# Patient Record
Sex: Female | Born: 1995 | Race: Black or African American | Hispanic: No | Marital: Single | Smoking: Never smoker
Health system: Southern US, Community
[De-identification: ages and names within clinical notes are randomized; demographics above are authoritative.]

## PROBLEM LIST (undated history)

## (undated) DIAGNOSIS — J45909 Unspecified asthma, uncomplicated: Secondary | ICD-10-CM

---

## 2015-01-14 ENCOUNTER — Emergency Department (HOSPITAL_COMMUNITY)
Admission: EM | Admit: 2015-01-14 | Discharge: 2015-01-15 | Disposition: A | Discharge status: Home / Self Care | Payer: Medicaid Other | Attending: Emergency Medicine | Admitting: Emergency Medicine

## 2015-01-14 ENCOUNTER — Emergency Department (HOSPITAL_COMMUNITY): Payer: Medicaid Other

## 2015-01-14 ENCOUNTER — Encounter (HOSPITAL_COMMUNITY): Payer: Self-pay | Admitting: Emergency Medicine

## 2015-01-14 DIAGNOSIS — Z3202 Encounter for pregnancy test, result negative: Secondary | ICD-10-CM | POA: Insufficient documentation

## 2015-01-14 DIAGNOSIS — J45901 Unspecified asthma with (acute) exacerbation: Secondary | ICD-10-CM | POA: Insufficient documentation

## 2015-01-14 DIAGNOSIS — R000 Tachycardia, unspecified: Secondary | ICD-10-CM | POA: Insufficient documentation

## 2015-01-14 DIAGNOSIS — J159 Unspecified bacterial pneumonia: Secondary | ICD-10-CM | POA: Insufficient documentation

## 2015-01-14 DIAGNOSIS — R0602 Shortness of breath: Secondary | ICD-10-CM | POA: Diagnosis present

## 2015-01-14 DIAGNOSIS — J189 Pneumonia, unspecified organism: Secondary | ICD-10-CM

## 2015-01-14 HISTORY — DX: Unspecified asthma, uncomplicated: J45.909

## 2015-01-14 LAB — CBC WITH DIFFERENTIAL/PLATELET
BASOS ABS: 0 10*3/uL (ref 0.0–0.1)
Basophils Relative: 0 %
EOS ABS: 0 10*3/uL (ref 0.0–0.7)
Eosinophils Relative: 1 %
HCT: 40.7 % (ref 36.0–46.0)
HEMOGLOBIN: 13.3 g/dL (ref 12.0–15.0)
LYMPHS ABS: 1.3 10*3/uL (ref 0.7–4.0)
LYMPHS PCT: 19 %
MCH: 28.9 pg (ref 26.0–34.0)
MCHC: 32.7 g/dL (ref 30.0–36.0)
MCV: 88.5 fL (ref 78.0–100.0)
Monocytes Absolute: 0.4 10*3/uL (ref 0.1–1.0)
Monocytes Relative: 6 %
NEUTROS PCT: 74 %
Neutro Abs: 4.8 10*3/uL (ref 1.7–7.7)
Platelets: 271 10*3/uL (ref 150–400)
RBC: 4.6 MIL/uL (ref 3.87–5.11)
RDW: 13.5 % (ref 11.5–15.5)
WBC: 6.5 10*3/uL (ref 4.0–10.5)

## 2015-01-14 LAB — COMPREHENSIVE METABOLIC PANEL
ALK PHOS: 66 U/L (ref 38–126)
ALT: 18 U/L (ref 14–54)
AST: 23 U/L (ref 15–41)
Albumin: 3.5 g/dL (ref 3.5–5.0)
Anion gap: 9 (ref 5–15)
CALCIUM: 9.4 mg/dL (ref 8.9–10.3)
CO2: 27 mmol/L (ref 22–32)
CREATININE: 0.79 mg/dL (ref 0.44–1.00)
Chloride: 102 mmol/L (ref 101–111)
GFR calc non Af Amer: >60 mL/min (ref >60)
Glucose, Bld: 110 mg/dL — ABNORMAL HIGH (ref 65–99)
Potassium: 3.4 mmol/L — ABNORMAL LOW (ref 3.5–5.1)
SODIUM: 138 mmol/L (ref 135–145)
Total Bilirubin: 0.2 mg/dL — ABNORMAL LOW (ref 0.3–1.2)
Total Protein: 7 g/dL (ref 6.5–8.1)

## 2015-01-14 LAB — POC URINE PREG, ED: Preg Test, Ur: NEGATIVE

## 2015-01-14 LAB — D-DIMER, QUANTITATIVE (NOT AT ARMC): D DIMER QUANT: 0.65 ug{FEU}/mL — AB (ref 0.00–0.48)

## 2015-01-14 MED ORDER — ALBUTEROL SULFATE (2.5 MG/3ML) 0.083% IN NEBU
5.0000 mg | INHALATION_SOLUTION | Freq: Once | RESPIRATORY_TRACT | Status: AC
  Administered 2015-01-14: 5 mg via RESPIRATORY_TRACT

## 2015-01-14 MED ORDER — SODIUM CHLORIDE 0.9 % IV BOLUS (SEPSIS)
500.0000 mL | Freq: Once | INTRAVENOUS | Status: AC
Start: 2015-01-14 — End: 2015-01-15
  Administered 2015-01-15: 1000 mL via INTRAVENOUS

## 2015-01-14 MED ORDER — ALBUTEROL SULFATE (2.5 MG/3ML) 0.083% IN NEBU
INHALATION_SOLUTION | RESPIRATORY_TRACT | Status: AC
  Filled 2015-01-14: qty 6

## 2015-01-14 NOTE — ED Notes (Signed)
The patient said she was sent here from the [facility] because they said she was tachycardic.  The patient said she has asthma but does not have any more refills.  She has been coughing and having SOB since earlier today.  She also says she is having left leg pain but it went away with ibuprofen.

## 2015-01-14 NOTE — ED Provider Notes (Signed)
CSN: [id number]     Arrival date & time 01/14/15  2000 History   First MD Initiated Contact with Patient 01/14/15 2242     Chief Complaint  Patient presents with  . Shortness of Breath    The patient said she was sent here from the student health center at [employer] because they said she was tachycardic.       (Consider location/radiation/quality/duration/timing/severity/associated sxs/prior Treatment) Patient is a 19 y.o. female presenting with shortness of breath. The history is provided by the patient.  Shortness of Breath Associated symptoms: no abdominal pain, no chest pain and no rash    patient presents with shortness of breath. She's had cough with minimal sputum production. Has had some chills. Has slight aching in her left arm and left leg. 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 Does have a history of asthma.  Past Medical History  Diagnosis Date  . Asthma    History reviewed. No pertinent past surgical history. History reviewed. No pertinent family history. Social History  Substance Use Topics  . Smoking status: Never Smoker   . Smokeless tobacco: Never Used  . Alcohol Use: Yes     Comment: occ   OB History    No data available     Review of Systems  Constitutional: Negative for chills.  HENT: Negative for dental problem.   Respiratory: Positive for shortness of breath.   Cardiovascular: Negative for chest pain.  Gastrointestinal: Negative for abdominal pain.  Genitourinary: Negative for flank pain.  Musculoskeletal: Negative for back pain.  Skin: Negative for rash.  Neurological: Negative for weakness and light-headedness.      Allergies  Review of patient's allergies indicates not on file.  Home Medications   Prior to Admission medications   Not on File   BP 151/87 mmHg  Pulse 121  Temp(Src) 99 F (37.2 C) (Oral)  Resp 16  SpO2 100%  LMP 12/31/2014 Physical Exam  Constitutional: She  appears well-developed.  HENT:  Head: Atraumatic.  Cardiovascular:  Tachycardia  Pulmonary/Chest: Effort normal and breath sounds normal.  Abdominal: Soft.  Musculoskeletal: She exhibits no edema or tenderness.  Neurological: She is alert.  Skin: Skin is warm.  Psychiatric: She has a normal mood and affect.    ED Course  Procedures (including critical care time) Labs Review Labs Reviewed  COMPREHENSIVE METABOLIC PANEL - Abnormal; Notable for the following:    Potassium 3.4 (*)    Glucose, Bld 110 (*)    BUN <5 (*)    Total Bilirubin 0.2 (*)    All other components within normal limits  D-DIMER, QUANTITATIVE (NOT AT [facility]) - Abnormal; Notable for the following:    D-Dimer, Quant 0.65 (*)    All other components within normal limits  CBC WITH DIFFERENTIAL/PLATELET  POC URINE PREG, ED    Imaging Review Dg Chest 2 View  01/14/2015   CLINICAL DATA:  Coughing.  Shortness of breath.  Tachycardia.  EXAM: CHEST  2 VIEW  COMPARISON:  None.  FINDINGS: Abnormal left lower lobe band of opacity best appreciated on the frontal projection. The lungs appear otherwise clear. No pleural effusion. Cardiac and mediastinal margins appear normal.  IMPRESSION: 1. Retrocardiac band of opacity in the left lower lobe. Likely differential diagnostic considerations given the radiographic appearance include atelectasis and pneumonia.   Electronically Signed   By: Gaylyn Rong M.D.   On: 01/14/2015 21:23   I have  personally reviewed and evaluated these images and lab results as part of my medical decision-making.   EKG Interpretation   Date/Time:  Monday January 14 2015 20:07:01 EDT Ventricular Rate:  125 PR Interval:  126 QRS Duration: 94 QT Interval:  316 QTC Calculation: 456 R Axis:   39 Text Interpretation:  Sinus tachycardia Inferior Q waves noted Abnormal  ECG Confirmed by Rubin Payor  MD, Harrold Donath [phone]) on 01/14/2015 11:00:38 PM      MDM   Final diagnoses:  None    Patient with  shortness of breath and slight chest pain. Tachycardia. Tachycardia appears and does have inferior Q waves. X-ray showed atelectasis versus pneumonia. Normal white count and no fever. D-dimer done and is not negative. Will get CT angiography.    Benjiman Core, MD 01/14/15 [phone]

## 2015-01-15 ENCOUNTER — Emergency Department (HOSPITAL_COMMUNITY): Payer: Medicaid Other

## 2015-01-15 ENCOUNTER — Encounter (HOSPITAL_COMMUNITY): Payer: Self-pay | Admitting: Radiology

## 2015-01-15 MED ORDER — IBUPROFEN 400 MG PO TABS
600.0000 mg | ORAL_TABLET | Freq: Once | ORAL | Status: AC
  Administered 2015-01-15: 600 mg via ORAL
  Filled 2015-01-15 (×2): qty 1

## 2015-01-15 MED ORDER — LEVOFLOXACIN 500 MG PO TABS: 500.0000 mg | ORAL_TABLET | Freq: Every day | ORAL | Status: DC

## 2015-01-15 MED ORDER — ACETAMINOPHEN 325 MG PO TABS
650.0000 mg | ORAL_TABLET | Freq: Once | ORAL | Status: AC
  Administered 2015-01-15: 650 mg via ORAL
  Filled 2015-01-15: qty 2

## 2015-01-15 MED ORDER — LEVOFLOXACIN 500 MG PO TABS
500.0000 mg | ORAL_TABLET | Freq: Once | ORAL | Status: AC
  Administered 2015-01-15: 500 mg via ORAL
  Filled 2015-01-15: qty 1

## 2015-01-15 MED ORDER — ALBUTEROL SULFATE HFA 108 (90 BASE) MCG/ACT IN AERS
2.0000 | INHALATION_SPRAY | RESPIRATORY_TRACT | Status: DC
  Administered 2015-01-15: 2 via RESPIRATORY_TRACT
  Filled 2015-01-15: qty 6.7

## 2015-01-15 MED ORDER — IOHEXOL 350 MG/ML SOLN
80.0000 mL | Freq: Once | INTRAVENOUS | Status: AC | PRN
  Administered 2015-01-15: 80 mL via INTRAVENOUS

## 2015-01-15 MED ORDER — SODIUM CHLORIDE 0.9 % IV BOLUS (SEPSIS)
1000.0000 mL | Freq: Once | INTRAVENOUS | Status: AC
  Administered 2015-01-15: 1000 mL via INTRAVENOUS

## 2015-01-15 NOTE — Discharge Instructions (Signed)

## 2015-01-15 NOTE — ED Notes (Signed)
Pt returned from CT and moved to C-29  Report given to [place], [place]

## 2015-01-15 NOTE — ED Notes (Signed)
Campos MD at bedside. 

## 2015-01-15 NOTE — ED Notes (Signed)
Pt alert and oriented x's 3.  Speaking in full sentences.

## 2015-01-15 NOTE — ED Notes (Signed)
Pt up to restroom.  States she has been urinating a lot today.

## 2015-01-15 NOTE — ED Notes (Signed)
Pt to CT at this time.

## 2015-01-15 NOTE — ED Notes (Signed)
Pt up to restroom.  Able to ambulate independently.  Denies severe SOB with walking, SpO2 99% on RA once arrived back to room.

## 2015-01-15 NOTE — ED Provider Notes (Signed)
3:29 AM Ambulate around emergency department without shortness of breath.  No hypoxia.  No significant increase in her heart rate with ambulation.  Much of her heart rate secondary bronchodilators and fever.  She was given within an additional 1 L IV fluid bolus.  She is found to have a fever 102 in emergency department.  Her CT scan is negative for pulmonary embolism and demonstrates left lower lobe pneumonia.  Patient was given Levaquin in the ER.  Discharge home on Levaquin.  Patient understands to return to the ER for new or worsening symptoms  Dg Chest 2 View  01/14/2015   CLINICAL DATA:  Coughing.  Shortness of breath.  Tachycardia.  EXAM: CHEST  2 VIEW  COMPARISON:  None.  FINDINGS: Abnormal left lower lobe band of opacity best appreciated on the frontal projection. The lungs appear otherwise clear. No pleural effusion. Cardiac and mediastinal margins appear normal.  IMPRESSION: 1. Retrocardiac band of opacity in the left lower lobe. Likely differential diagnostic considerations given the radiographic appearance include atelectasis and pneumonia.   Electronically Signed   By: Gaylyn Rong M.D.   On: 01/14/2015 21:23   Ct Angio Chest Pe W/cm &/or Wo Cm  01/15/2015   CLINICAL DATA:  Tachycardia, cough, LEFT extremity pain. History of asthma.  EXAM: CT ANGIOGRAPHY CHEST WITH CONTRAST  TECHNIQUE: Multidetector CT imaging of the chest was performed using the standard protocol during bolus administration of intravenous contrast. Multiplanar CT image reconstructions and MIPs were obtained to evaluate the vascular anatomy.  CONTRAST:  80mL OMNIPAQUE IOHEXOL 350 MG/ML SOLN  COMPARISON:  Chest radiograph January 14, 2015  FINDINGS: Large body habitus results in overall noisy image quality.  PULMONARY ARTERY: Adequate contrast opacification of the pulmonary artery's. Main pulmonary artery is not enlarged. No pulmonary arterial filling defects to the level of the subsegmental branches.  MEDIASTINUM: Heart  and pericardium are unremarkable, no right heart strain. Thoracic aorta is normal course and caliber, unremarkable. Mild LEFT hilar lymphadenopathy, prominent though not pathologically enlarged subcarinal lymph node.  LUNGS: Tracheobronchial tree is patent, no pneumothorax. Mild heterogeneous lung attenuation compatible with small airway disease. Mild bronchial wall thickening. Patchy consolidation LEFT lower lobe. No pleural effusion.  SOFT TISSUES AND OSSEOUS STRUCTURES: Included view of the abdomen is unremarkable. Visualized soft tissues and included osseous structures appear normal.  Review of the MIP images confirms the above findings.  IMPRESSION: No acute pulmonary embolism.  LEFT lower lobe bronchopneumonia. LEFT hilar lymphadenopathy is likely reactive.   Electronically Signed   By: Awilda Metro M.D.   On: 01/15/2015 01:16   I personally reviewed the imaging tests through PACS system I reviewed available ER/hospitalization records through the EMR   EKG Interpretation  Date/Time:  Monday January 14 2015 20:07:01 EDT Ventricular Rate:  125 PR Interval:  126 QRS Duration: 94 QT Interval:  316 QTC Calculation: 456 R Axis:   39 Text Interpretation:  Sinus tachycardia Inferior Q waves noted Abnormal ECG Confirmed by Rubin Payor  MD, NATHAN [phone]) on 01/14/2015 11:00:38 PM        Azalia Bilis, MD 01/15/15 0330

## 2015-01-15 NOTE — ED Notes (Signed)
Patient left at this time with all belongings. 

## 2015-03-28 ENCOUNTER — Encounter (HOSPITAL_COMMUNITY): Payer: Self-pay | Admitting: Emergency Medicine

## 2015-03-28 ENCOUNTER — Emergency Department (HOSPITAL_COMMUNITY)
Admission: EM | Admit: 2015-03-28 | Discharge: 2015-03-28 | Disposition: A | Discharge status: Home / Self Care | Payer: Medicaid Other | Attending: Emergency Medicine | Admitting: Emergency Medicine

## 2015-03-28 DIAGNOSIS — R21 Rash and other nonspecific skin eruption: Secondary | ICD-10-CM | POA: Insufficient documentation

## 2015-03-28 DIAGNOSIS — R002 Palpitations: Secondary | ICD-10-CM

## 2015-03-28 DIAGNOSIS — J45909 Unspecified asthma, uncomplicated: Secondary | ICD-10-CM | POA: Insufficient documentation

## 2015-03-28 DIAGNOSIS — R0602 Shortness of breath: Secondary | ICD-10-CM | POA: Insufficient documentation

## 2015-03-28 DIAGNOSIS — Z79899 Other long term (current) drug therapy: Secondary | ICD-10-CM | POA: Insufficient documentation

## 2015-03-28 DIAGNOSIS — Z792 Long term (current) use of antibiotics: Secondary | ICD-10-CM | POA: Insufficient documentation

## 2015-03-28 LAB — BASIC METABOLIC PANEL
ANION GAP: 8 (ref 5–15)
BUN: 7 mg/dL (ref 6–20)
CO2: 28 mmol/L (ref 22–32)
Calcium: 9.3 mg/dL (ref 8.9–10.3)
Chloride: 103 mmol/L (ref 101–111)
Creatinine, Ser: 0.77 mg/dL (ref 0.44–1.00)
GFR calc Af Amer: >60 mL/min (ref >60)
GLUCOSE: 118 mg/dL — AB (ref 65–99)
POTASSIUM: 3.4 mmol/L — AB (ref 3.5–5.1)
Sodium: 139 mmol/L (ref 135–145)

## 2015-03-28 LAB — CBC WITH DIFFERENTIAL/PLATELET
BASOS PCT: 0 %
Basophils Absolute: 0 10*3/uL (ref 0.0–0.1)
EOS ABS: 0.2 10*3/uL (ref 0.0–0.7)
Eosinophils Relative: 2 %
HCT: 40.6 % (ref 36.0–46.0)
HEMOGLOBIN: 13 g/dL (ref 12.0–15.0)
LYMPHS ABS: 1.7 10*3/uL (ref 0.7–4.0)
Lymphocytes Relative: 19 %
MCH: 28.7 pg (ref 26.0–34.0)
MCHC: 32 g/dL (ref 30.0–36.0)
MCV: 89.6 fL (ref 78.0–100.0)
Monocytes Absolute: 0.3 10*3/uL (ref 0.1–1.0)
Monocytes Relative: 3 %
NEUTROS PCT: 76 %
Neutro Abs: 6.7 10*3/uL (ref 1.7–7.7)
Platelets: 321 10*3/uL (ref 150–400)
RBC: 4.53 MIL/uL (ref 3.87–5.11)
RDW: 13.4 % (ref 11.5–15.5)
WBC: 8.9 10*3/uL (ref 4.0–10.5)

## 2015-03-28 LAB — I-STAT BETA HCG BLOOD, ED (MC, WL, AP ONLY): I-stat hCG, quantitative: <5 m[IU]/mL (ref <5)

## 2015-03-28 LAB — D-DIMER, QUANTITATIVE (NOT AT ARMC): D DIMER QUANT: 0.39 ug{FEU}/mL (ref 0.00–0.50)

## 2015-03-28 LAB — TSH: TSH: 0.841 u[IU]/mL (ref 0.350–4.500)

## 2015-03-28 MED ORDER — SODIUM CHLORIDE 0.9 % IV BOLUS (SEPSIS)
1000.0000 mL | Freq: Once | INTRAVENOUS | Status: AC
  Administered 2015-03-28: 1000 mL via INTRAVENOUS

## 2015-03-28 MED ORDER — METHYLPREDNISOLONE SODIUM SUCC 125 MG IJ SOLR
125.0000 mg | Freq: Once | INTRAMUSCULAR | Status: AC
  Administered 2015-03-28: 125 mg via INTRAVENOUS
  Filled 2015-03-28: qty 2

## 2015-03-28 MED ORDER — DIPHENHYDRAMINE HCL 50 MG/ML IJ SOLN
25.0000 mg | Freq: Once | INTRAMUSCULAR | Status: AC
  Administered 2015-03-28: 25 mg via INTRAVENOUS
  Filled 2015-03-28: qty 1

## 2015-03-28 NOTE — Discharge Instructions (Signed)
Follow-up the results of your thyroid study. If this test is abnormal, follow-up with your primary Dr. If the results are normal, follow-up with cardiology. The cardiology clinic contact information has been provided in this discharge summary for you to call and arrange this appointment.  Return to the emergency department if your symptoms significantly worsen or change.   Palpitations A palpitation is the feeling that your heartbeat is irregular or is faster than normal. It may feel like your heart is fluttering or skipping a beat. Palpitations are usually not a serious problem. However, in some cases, you may need further medical evaluation. CAUSES  Palpitations can be caused by:  Smoking.  Caffeine or other stimulants, such as diet pills or energy drinks.  Alcohol.  Stress and anxiety.  Strenuous physical activity.  Fatigue.  Certain medicines.  Heart disease, especially if you have a history of irregular heart rhythms (arrhythmias), such as atrial fibrillation, atrial flutter, or supraventricular tachycardia.  An improperly working pacemaker or defibrillator. DIAGNOSIS  To find the cause of your palpitations, your health care provider will take your medical history and perform a physical exam. Your health care provider may also have you take a test called an ambulatory electrocardiogram (ECG). An ECG records your heartbeat patterns over a 24-hour period. You may also have other tests, such as:  Transthoracic echocardiogram (TTE). During echocardiography, sound waves are used to evaluate how blood flows through your heart.  Transesophageal echocardiogram (TEE).  Cardiac monitoring. This allows your health care provider to monitor your heart rate and rhythm in real time.  Holter monitor. This is a portable device that records your heartbeat and can help diagnose heart arrhythmias. It allows your health care provider to track your heart activity for several days, if  needed.  Stress tests by exercise or by giving medicine that makes the heart beat faster. TREATMENT  Treatment of palpitations depends on the cause of your symptoms and can vary greatly. Most cases of palpitations do not require any treatment other than time, relaxation, and monitoring your symptoms. Other causes, such as atrial fibrillation, atrial flutter, or supraventricular tachycardia, usually require further treatment. HOME CARE INSTRUCTIONS   Avoid:  Caffeinated coffee, tea, soft drinks, diet pills, and energy drinks.  Chocolate.  Alcohol.  Stop smoking if you smoke.  Reduce your stress and anxiety. Things that can help you relax include:  A method of controlling things in your body, such as your heartbeats, with your mind (biofeedback).  Yoga.  Meditation.  Physical activity such as swimming, jogging, or walking.  Get plenty of rest and sleep. SEEK MEDICAL CARE IF:   You continue to have a fast or irregular heartbeat beyond 24 hours.  Your palpitations occur more often. SEEK IMMEDIATE MEDICAL CARE IF:  You have chest pain or shortness of breath.  You have a severe headache.  You feel dizzy or you faint. MAKE SURE YOU:  Understand these instructions.  Will watch your condition.  Will get help right away if you are not doing well or get worse.   This information is not intended to replace advice given to you by your health care provider. Make sure you discuss any questions you have with your health care provider.   Document Released: 04/03/2000 Document Revised: 04/11/2013 Document Reviewed: 06/05/2011 Elsevier Interactive Patient Education [employer]2016 Elsevier Inc.

## 2015-03-28 NOTE — ED Notes (Signed)
Pt arrived by POV. States that she has been having SOB and rapid heart since yesterday evening. Pt states she had some chest pain on the 6th. Having positional chest pain. Also has a new rash that started about an hour ago.

## 2015-03-28 NOTE — ED Provider Notes (Signed)
CSN: [id number]     Arrival date & time 03/28/15  0327 History  By signing my name below, I, Tanda RockersMargaux Venter, attest that this documentation has been prepared under the direction and in the presence of Geoffery Lyonsouglas Nature Kueker, MD. Electronically Signed: Tanda RockersMargaux Venter, ED Scribe. 03/28/2015. 4:01 AM.   Chief Complaint  Patient presents with  . Tachycardia  . Shortness of Breath  . Urticaria   The history is provided by the patient. No language interpreter was used.     HPI Comments: Holly Baird is a 19 y.o. female who presents to the Emergency Department complaining of gradual onset, itchy, rash to forearms that began approximately 1 hour ago. Pt mentions that he husband woke her up tonight because he noticed the rash. Pt also complains of gradual onset, intermittent, chest pain that began 2 day ago. Pt also complains of tachycardia and shortness of breath x 1 day. No new change in medication. Denies diaphoresis, nausea, vomiting, leg swelling, or any other associated symptoms. LNMP: Now.    Past Medical History  Diagnosis Date  . Asthma    History reviewed. No pertinent past surgical history. History reviewed. No pertinent family history. Social History  Substance Use Topics  . Smoking status: Never Smoker   . Smokeless tobacco: Never Used  . Alcohol Use: Yes     Comment: occ   OB History    No data available     Review of Systems  A complete 10 system review of systems was obtained and all systems are negative except as noted in the HPI and PMH.   Allergies  Cephalexin  Home Medications   Prior to Admission medications   Medication Sig Start Date End Date Taking? Authorizing Provider  BLISOVI FE 1/20 1-20 MG-MCG tablet Take 1 tablet by mouth daily. 12/05/14   Historical Provider, MD  levofloxacin (LEVAQUIN) 500 MG tablet Take 1 tablet (500 mg total) by mouth daily. 01/15/15   Azalia BilisKevin Campos, MD   Triage Vitals: BP 158/96 mmHg  Pulse 125  SpO2 99%  LMP 03/28/2015   Physical Exam   Constitutional: She is oriented to person, place, and time. She appears well-developed and well-nourished. No distress.  HENT:  Head: Normocephalic and atraumatic.  Eyes: EOM are normal.  Neck: Normal range of motion.  Cardiovascular: Regular rhythm, S1 normal, S2 normal and normal heart sounds.  Tachycardia present.   Pulmonary/Chest: Breath sounds normal. No respiratory distress. She has no wheezes. She has no rales.  Abdominal: Soft. She exhibits no distension. There is no tenderness.  Musculoskeletal: Normal range of motion.  Neurological: She is alert and oriented to person, place, and time.  Skin: Skin is warm and dry. Rash noted.  There is a macular rash present to both forearms. This blanches and is pruritic.   Psychiatric: She has a normal mood and affect. Judgment normal.  Nursing note and vitals reviewed.   ED Course  Procedures (including critical care time)  DIAGNOSTIC STUDIES: Oxygen Saturation is 99% on RA, normal by my interpretation.    COORDINATION OF CARE: 4:00 AM-Discussed treatment plan with pt at bedside and pt agreed to plan.   Labs Review Labs Reviewed - No data to display  Imaging Review No results found. I have personally reviewed and evaluated these images and lab results as part of my medical decision-making.   EKG Interpretation   Date/Time:  Thursday March 28 2015 04:22:04 EST Ventricular Rate:  113 PR Interval:  141 QRS Duration: 81 QT Interval:  314 QTC Calculation: 430 R Axis:   61 Text Interpretation:  Sinus tachycardia Borderline Q waves in lateral  leads Confirmed by Kynslie Ringle  MD, Dannika Hilgeman ([id number]) on 03/28/2015 4:35:19 AM      MDM   Final diagnoses:  None    Patient presents with complaints of palpitations that woke her from sleep. She's been having episodes of shortness of breath and rapid heart rate. She was seen for similar complaints in September and had extensive workup performed, however no cause was found. According to her  mom, she has been running an elevated heart rate that has been unexplained. Her workup today is essentially unremarkable. Her d-dimer is negative and I highly doubt pulmonary embolism as the cause. It does not appears that she has had a TSH performed in the past, and this will be added on today's laboratory studies. If her TSH is abnormal, I will have her follow-up with her primary Dr. If the TSH is normal, I feel as though cardiology follow-up is appropriate. Patient will be advised to follow the results of TSH on 'my chart' and arrange the appropriate follow-up based on the results.   Nothing today appears emergent and she appears very stable and appropriate for discharge.  I personally performed the services described in this documentation, which was scribed in my presence. The recorded information has been reviewed and is accurate.       Geoffery Lyons, MD 03/28/15 0530

## 2015-03-29 ENCOUNTER — Encounter (HOSPITAL_COMMUNITY): Payer: Self-pay | Admitting: Emergency Medicine

## 2015-03-29 ENCOUNTER — Emergency Department (HOSPITAL_COMMUNITY): Payer: BLUE CROSS/BLUE SHIELD

## 2015-03-29 ENCOUNTER — Emergency Department (INDEPENDENT_AMBULATORY_CARE_PROVIDER_SITE_OTHER)
Admission: EM | Admit: 2015-03-29 | Discharge: 2015-03-29 | Disposition: A | Discharge status: Nursing Home (Includes ICF) | Payer: BLUE CROSS/BLUE SHIELD | Source: Home / Self Care | Attending: Family Medicine | Admitting: Family Medicine

## 2015-03-29 ENCOUNTER — Emergency Department (HOSPITAL_COMMUNITY)
Admission: EM | Admit: 2015-03-29 | Discharge: 2015-03-30 | Disposition: A | Discharge status: Home / Self Care | Payer: BLUE CROSS/BLUE SHIELD | Attending: Emergency Medicine | Admitting: Emergency Medicine

## 2015-03-29 DIAGNOSIS — R000 Tachycardia, unspecified: Secondary | ICD-10-CM | POA: Diagnosis not present

## 2015-03-29 DIAGNOSIS — R21 Rash and other nonspecific skin eruption: Secondary | ICD-10-CM | POA: Diagnosis not present

## 2015-03-29 DIAGNOSIS — Z79899 Other long term (current) drug therapy: Secondary | ICD-10-CM | POA: Insufficient documentation

## 2015-03-29 DIAGNOSIS — K759 Inflammatory liver disease, unspecified: Secondary | ICD-10-CM | POA: Diagnosis not present

## 2015-03-29 DIAGNOSIS — R0789 Other chest pain: Secondary | ICD-10-CM | POA: Diagnosis not present

## 2015-03-29 DIAGNOSIS — R509 Fever, unspecified: Secondary | ICD-10-CM

## 2015-03-29 DIAGNOSIS — Z79818 Long term (current) use of other agents affecting estrogen receptors and estrogen levels: Secondary | ICD-10-CM | POA: Insufficient documentation

## 2015-03-29 DIAGNOSIS — J45901 Unspecified asthma with (acute) exacerbation: Secondary | ICD-10-CM | POA: Diagnosis not present

## 2015-03-29 LAB — URINE MICROSCOPIC-ADD ON: RBC / HPF: NONE SEEN RBC/hpf (ref 0–5)

## 2015-03-29 LAB — URINALYSIS, ROUTINE W REFLEX MICROSCOPIC
Glucose, UA: NEGATIVE mg/dL
HGB URINE DIPSTICK: NEGATIVE
KETONES UR: NEGATIVE mg/dL
NITRITE: NEGATIVE
PROTEIN: NEGATIVE mg/dL
Specific Gravity, Urine: 1.026 (ref 1.005–1.030)
pH: 6.5 (ref 5.0–8.0)

## 2015-03-29 MED ORDER — IBUPROFEN 800 MG PO TABS
800.0000 mg | ORAL_TABLET | Freq: Once | ORAL | Status: AC
  Administered 2015-03-29: 800 mg via ORAL
  Filled 2015-03-29: qty 1

## 2015-03-29 MED ORDER — SODIUM CHLORIDE 0.9 % IV BOLUS (SEPSIS)
1000.0000 mL | Freq: Once | INTRAVENOUS | Status: AC
  Administered 2015-03-29: 1000 mL via INTRAVENOUS

## 2015-03-29 NOTE — ED Notes (Signed)
Pt presents with HR in 140's and difficulty taking deep breath. Diffuse rash noted, pt denies urticaria. Pt has been seen several times for same without resolution. Pt calm in triage, NAD.

## 2015-03-29 NOTE — ED Notes (Signed)
Bed: WA23 Expected date:  Expected time:  Means of arrival:  Comments: Triage 1 

## 2015-03-29 NOTE — ED Provider Notes (Signed)
CSN: [id number]     Arrival date & time 03/29/15  2031 History   First MD Initiated Contact with Patient 03/29/15 2116     Chief Complaint  Patient presents with  . Tachycardia     (Consider location/radiation/quality/duration/timing/severity/associated sxs/prior Treatment) The history is provided by the patient and a relative (The patient's mother is a family Publishing rights manager).   patient presents with shortness of breath and upper chest pain. Has been going for the last 2 days. Seen in the ER yesterday for shortness of breath chest pain and a diffuse rash. She's had a tachycardia. She was seen at urgent care and sent here. Given steroids and benadryl yesterday. Had d-dimer and reassuring blood work then. Around 3 months ago she was seen by me in the ER and found to have a pneumonia. She was tachycardic event also. She had a CT angiography which did not show pulmonary embolism. The pain now is dull and in her upper chest. She will occasionally get cramping in her right arm and occasionally in the left leg.   Past Medical History  Diagnosis Date  . Asthma    History reviewed. No pertinent past surgical history. No family history on file. Social History  Substance Use Topics  . Smoking status: Never Smoker   . Smokeless tobacco: Never Used  . Alcohol Use: Yes     Comment: occ   OB History    No data available     Review of Systems  HENT: Negative for sore throat.   Respiratory: Positive for chest tightness and shortness of breath. Negative for cough.   Gastrointestinal: Negative for abdominal pain.  Genitourinary: Negative for frequency.  Musculoskeletal: Negative for back pain.  Skin: Positive for color change and rash. Negative for wound.  Neurological: Negative for weakness and numbness.  Hematological: Negative for adenopathy.      Allergies  Apple and Cephalexin  Home Medications   Prior to Admission medications   Medication Sig Start Date End Date Taking?  Authorizing Provider  albuterol (PROVENTIL HFA;VENTOLIN HFA) 108 (90 BASE) MCG/ACT inhaler Inhale 2 puffs into the lungs every 6 (six) hours as needed for wheezing or shortness of breath.   Yes Historical Provider, MD  norethindrone-ethinyl estradiol (JUNEL FE,GILDESS FE,LOESTRIN FE) 1-20 MG-MCG tablet Take 1 tablet by mouth daily.   Yes Historical Provider, MD  levofloxacin (LEVAQUIN) 500 MG tablet Take 1 tablet (500 mg total) by mouth daily. Patient not taking: Reported on 03/29/2015 01/15/15   Azalia Bilis, MD   BP 144/91 mmHg  Pulse 118  Temp(Src) 100.9 F (38.3 C) (Oral)  Resp 23  SpO2 100%  LMP 03/24/2015 Physical Exam  Constitutional: She appears well-developed.  HENT:  Head: Atraumatic.  Mouth/Throat: No oropharyngeal exudate.  Neck: No tracheal deviation present.  Cardiovascular: Regular rhythm.   Tachycardia  Pulmonary/Chest: Effort normal.  Mild tachypnea. No rales. No wheezes.  Abdominal: Soft.  Lymphadenopathy:    She has no cervical adenopathy.  Neurological: She is alert.  Skin: There is erythema.  Skin feels hot. Erythematous rash to back and arms. Also some on legs. Mostly blanching.    ED Course  Procedures (including critical care time) Labs Review Labs Reviewed  URINALYSIS, ROUTINE W REFLEX MICROSCOPIC (NOT AT [facility]) - Abnormal; Notable for the following:    Color, Urine ORANGE (*)    Bilirubin Urine LARGE (*)    Leukocytes, UA SMALL (*)    All other components within normal limits  URINE MICROSCOPIC-ADD ON - Abnormal;  Notable for the following:    Squamous Epithelial / LPF 0-5 (*)    Bacteria, UA RARE (*)    All other components within normal limits  HEPATIC FUNCTION PANEL  SEDIMENTATION RATE  URINE RAPID DRUG SCREEN, HOSP PERFORMED  CBC WITH DIFFERENTIAL/PLATELET  BASIC METABOLIC PANEL  I-STAT CG4 LACTIC ACID, ED    Imaging Review Dg Chest 2 View  03/29/2015  CLINICAL DATA:  19 year old with tachycardia, shortness of breath, fever, left-sided  chest pain and urticaria. Personal history of pneumonia in September, 2016. EXAM: CHEST  2 VIEW COMPARISON:  01/14/2015 chest x-ray and CTA chest 01/15/2015. FINDINGS: Suboptimal inspiration accounts for crowded bronchovascular markings, especially in the bases, and accentuates the cardiac silhouette. Taking this into account, cardiomediastinal silhouette unremarkable and unchanged. Interval resolution of the airspace consolidation in the medial left lower lobe with minimal scarring. Lungs otherwise clear. No localized airspace consolidation. No pleural effusions. No pneumothorax. Normal pulmonary vascularity. Visualized bony thorax intact. IMPRESSION: Suboptimal inspiration.  No acute cardiopulmonary disease. Electronically Signed   By: Hulan Saashomas  Lawrence M.D.   On: 03/29/2015 21:55   I have personally reviewed and evaluated these images and lab results as part of my medical decision-making.   EKG Interpretation   Date/Time:  Friday March 29 2015 20:40:36 EST Ventricular Rate:  143 PR Interval:  108 QRS Duration: 79 QT Interval:  333 QTC Calculation: 514 R Axis:   28 Text Interpretation:  Sinus tachycardia inferior Q waves still present.  Prolonged QT interval Baseline wander in lead(s) II III aVF rate mildly  incresed since last tracing.  Confirmed by Rubin PayorPICKERING  MD, Harrold DonathNATHAN [phone]  on 03/29/2015 9:19:17 PM      MDM   Final diagnoses:  Tachycardia  Fever, unspecified fever cause    Pati presents with fever and tachycardia. Has been seen previously and diagnosed with pneumonia. X-ray shows no pneumonia this time. Negative d-dimer yesterday. Urinalysis does show elevated bilirubin. Will add some more lab work. We'll give IV fluids. Discussed with Dr. Antoine PocheHochrein from cardiology will arrange follow-up. Had extensive discussion with the patient and her mother, was family Publishing rights managernurse practitioner. Care turned over to Dr. Elberta LeatherwoodGlick    Tazia Illescas, MD 03/29/15 [phone]

## 2015-03-29 NOTE — Progress Notes (Signed)
Patent listed as having [employer] insurance without a pcp.  EDCM spoke to patient at bedside.  Patient confirms [place]cp [name].  System updated.

## 2015-03-29 NOTE — ED Notes (Signed)
Patient reported complaints of burning sensation in center back. Onset of this was 2 hours ago.   Area was itching initially and now burning.  While assessing patient, noticed red areas to skin, splotchy areas, not raised and these areas not itching.  Patient reports going to the ed for sob, rash, urticaria, swelling 12/8.  Also, during assessment by physician, mother brings up concerns for heart rate in 120's since September 2016.  Mother reports patient has had pneumonia and has been treated with 2 rounds of levaquin and reports pcp in [place] performed chest xray 3 weeks ago and reported this as negative.  Patient and mother with many concerns and not all data in [place] epic, at least one visit by pcp in [place].

## 2015-03-29 NOTE — ED Provider Notes (Signed)
CSN: [id number]     Arrival date & time 03/29/15  1916 History   First MD Initiated Contact with Patient 03/29/15 1954     Chief Complaint  Patient presents with  . Back Pain   (Consider location/radiation/quality/duration/timing/severity/associated sxs/prior Treatment) Patient is a 19 y.o. female presenting with back pain. The history is provided by the patient and a parent.  Back Pain Location:  Generalized Quality:  Burning Radiates to:  Does not radiate Pain severity:  No pain Progression:  Unchanged Chronicity:  Chronic Context comment:  Sx for 3 mos of tachycardia, cap, 2 rounds of levaquin, given solumedrol and bendaryl for itchy rash, also with chest pain. Associated symptoms: chest pain, fever and numbness   Risk factors: steroid use     Past Medical History  Diagnosis Date  . Asthma    History reviewed. No pertinent past surgical history. No family history on file. Social History  Substance Use Topics  . Smoking status: Never Smoker   . Smokeless tobacco: Never Used  . Alcohol Use: Yes     Comment: occ   OB History    No data available     Review of Systems  Constitutional: Positive for fever.  Respiratory: Positive for shortness of breath.   Cardiovascular: Positive for chest pain and palpitations.  Gastrointestinal: Negative.   Musculoskeletal: Positive for back pain.  Skin: Positive for rash.  Neurological: Positive for numbness.  All other systems reviewed and are negative.   Allergies  Apple and Cephalexin  Home Medications   Prior to Admission medications   Medication Sig Start Date End Date Taking? Authorizing Provider  levofloxacin (LEVAQUIN) 500 MG tablet Take 1 tablet (500 mg total) by mouth daily. 01/15/15   Azalia BilisKevin Campos, MD   Meds Ordered and Administered this Visit  Medications - No data to display  BP 138/92 mmHg  Pulse 130  Temp(Src) 99 F (37.2 C) (Oral)  Resp 28  SpO2 99%  LMP 03/28/2015 No data found.   Physical Exam   Constitutional: She is oriented to person, place, and time. She appears well-developed and well-nourished. No distress.  HENT:  Right Ear: External ear normal.  Left Ear: External ear normal.  Mouth/Throat: Oropharynx is clear and moist.  Neck: Normal range of motion. Neck supple.  Cardiovascular: Regular rhythm, normal heart sounds, intact distal pulses and normal pulses.  Tachycardia present.   Pulmonary/Chest: Effort normal and breath sounds normal.  Musculoskeletal: Normal range of motion.  Neurological: She is alert and oriented to person, place, and time.  Skin: Skin is warm and dry. Rash noted. There is erythema.  Generalized skin hyperemia.no focal lesions.  Nursing note and vitals reviewed.   ED Course  Procedures (including critical care time)  Labs Review Labs Reviewed - No data to display  Imaging Review No results found.   Visual Acuity Review  Right Eye Distance:   Left Eye Distance:   Bilateral Distance:    Right Eye Near:   Left Eye Near:    Bilateral Near:         MDM   1. Tachycardia determined by examination of pulse    Sent for re-eval of sx seen last eve and continue with concern by pt and mother.    Linna HoffJames D Weda Baumgarner, MD 03/29/15 2021

## 2015-03-30 DIAGNOSIS — R000 Tachycardia, unspecified: Secondary | ICD-10-CM | POA: Diagnosis not present

## 2015-03-30 LAB — BASIC METABOLIC PANEL
ANION GAP: 9 (ref 5–15)
BUN: 8 mg/dL (ref 6–20)
CALCIUM: 8.5 mg/dL — AB (ref 8.9–10.3)
CO2: 26 mmol/L (ref 22–32)
CREATININE: 0.85 mg/dL (ref 0.44–1.00)
Chloride: 104 mmol/L (ref 101–111)
Glucose, Bld: 85 mg/dL (ref 65–99)
Potassium: 3.5 mmol/L (ref 3.5–5.1)
SODIUM: 139 mmol/L (ref 135–145)

## 2015-03-30 LAB — RAPID URINE DRUG SCREEN, HOSP PERFORMED
AMPHETAMINES: NOT DETECTED
BARBITURATES: NOT DETECTED
Benzodiazepines: NOT DETECTED
Cocaine: NOT DETECTED
Opiates: NOT DETECTED
TETRAHYDROCANNABINOL: NOT DETECTED

## 2015-03-30 LAB — CBC WITH DIFFERENTIAL/PLATELET
BASOS ABS: 0 10*3/uL (ref 0.0–0.1)
BASOS PCT: 0 %
EOS ABS: 0.5 10*3/uL (ref 0.0–0.7)
EOS PCT: 6 %
HCT: 39.8 % (ref 36.0–46.0)
Hemoglobin: 13.2 g/dL (ref 12.0–15.0)
Lymphocytes Relative: 17 %
Lymphs Abs: 1.4 10*3/uL (ref 0.7–4.0)
MCH: 29.3 pg (ref 26.0–34.0)
MCHC: 33.2 g/dL (ref 30.0–36.0)
MCV: 88.2 fL (ref 78.0–100.0)
MONO ABS: 0.2 10*3/uL (ref 0.1–1.0)
Monocytes Relative: 2 %
NEUTROS ABS: 6.3 10*3/uL (ref 1.7–7.7)
Neutrophils Relative %: 75 %
PLATELETS: 330 10*3/uL (ref 150–400)
RBC: 4.51 MIL/uL (ref 3.87–5.11)
RDW: 13.4 % (ref 11.5–15.5)
WBC: 8.5 10*3/uL (ref 4.0–10.5)

## 2015-03-30 LAB — HEPATIC FUNCTION PANEL
ALBUMIN: 3.2 g/dL — AB (ref 3.5–5.0)
ALT: 281 U/L — ABNORMAL HIGH (ref 14–54)
AST: 180 U/L — ABNORMAL HIGH (ref 15–41)
Alkaline Phosphatase: 144 U/L — ABNORMAL HIGH (ref 38–126)
BILIRUBIN DIRECT: 1.3 mg/dL — AB (ref 0.1–0.5)
BILIRUBIN INDIRECT: 0.7 mg/dL (ref 0.3–0.9)
BILIRUBIN TOTAL: 2 mg/dL — AB (ref 0.3–1.2)
Total Protein: 6.5 g/dL (ref 6.5–8.1)

## 2015-03-30 LAB — SEDIMENTATION RATE: SED RATE: 40 mm/h — AB (ref 0–22)

## 2015-03-30 LAB — I-STAT CG4 LACTIC ACID, ED: LACTIC ACID, VENOUS: 0.79 mmol/L (ref 0.5–2.0)

## 2015-03-30 MED ORDER — ONDANSETRON 8 MG PO TBDP
8.0000 mg | ORAL_TABLET | Freq: Once | ORAL | Status: AC
  Administered 2015-03-30: 8 mg via ORAL
  Filled 2015-03-30: qty 1

## 2015-03-30 MED ORDER — ONDANSETRON HCL 4 MG PO TABS: 4.0000 mg | ORAL_TABLET | Freq: Four times a day (QID) | ORAL | Status: AC | PRN

## 2015-03-30 NOTE — ED Provider Notes (Signed)
Patient initially seen and evaluated by Dr. Rubin Baird with unexplained tachycardia and fever. She was given IV hydration with little change in her heart rate. Laboratory workup shows moderate elevations of AST and ALT and mild elevation of alkaline phosphatase and also elevation of the lumen which is predominantly unconjugated. This is suggestive of viral hepatitis. Hepatitis panel has been drawn. This is been discussed with patient and family. I have recommended follow-up with PCP in 4-5 days to discuss the results of her hepatitis panel and to plan treatment going forward. She has been having difficulty with nausea so she is discharged with prescription for ondansetron. She's given a note to be off work until she can see her PCP.  Results for orders placed or performed during the hospital encounter of 03/29/15  Urinalysis, Routine w reflex microscopic (not at [facility])  Result Value Ref Range   Color, Urine ORANGE (A) YELLOW   APPearance CLEAR CLEAR   Specific Gravity, Urine 1.026 1.005 - 1.030   pH 6.5 5.0 - 8.0   Glucose, UA NEGATIVE NEGATIVE mg/dL   Hgb urine dipstick NEGATIVE NEGATIVE   Bilirubin Urine LARGE (A) NEGATIVE   Ketones, ur NEGATIVE NEGATIVE mg/dL   Protein, ur NEGATIVE NEGATIVE mg/dL   Nitrite NEGATIVE NEGATIVE   Leukocytes, UA SMALL (A) NEGATIVE  Urine microscopic-add on  Result Value Ref Range   Squamous Epithelial / LPF 0-5 (A) NONE SEEN   WBC, UA 0-5 0 - 5 WBC/hpf   RBC / HPF NONE SEEN 0 - 5 RBC/hpf   Bacteria, UA RARE (A) NONE SEEN   Urine-Other MUCOUS PRESENT   Hepatic function panel  Result Value Ref Range   Total Protein 6.5 6.5 - 8.1 g/dL   Albumin 3.2 (L) 3.5 - 5.0 g/dL   AST 161 (H) 15 - 41 U/L   ALT 281 (H) 14 - 54 U/L   Alkaline Phosphatase 144 (H) 38 - 126 U/L   Total Bilirubin 2.0 (H) 0.3 - 1.2 mg/dL   Bilirubin, Direct 1.3 (H) 0.1 - 0.5 mg/dL   Indirect Bilirubin 0.7 0.3 - 0.9 mg/dL  Sedimentation rate  Result Value Ref Range   Sed Rate 40 (H) 0 - 22  mm/hr  Urine rapid drug screen (hosp performed)  Result Value Ref Range   Opiates NONE DETECTED NONE DETECTED   Cocaine NONE DETECTED NONE DETECTED   Benzodiazepines NONE DETECTED NONE DETECTED   Amphetamines NONE DETECTED NONE DETECTED   Tetrahydrocannabinol NONE DETECTED NONE DETECTED   Barbiturates NONE DETECTED NONE DETECTED  CBC with Differential  Result Value Ref Range   WBC 8.5 4.0 - 10.5 K/uL   RBC 4.51 3.87 - 5.11 MIL/uL   Hemoglobin 13.2 12.0 - 15.0 g/dL   HCT 09.6 04.5 - 40.9 %   MCV 88.2 78.0 - 100.0 fL   MCH 29.3 26.0 - 34.0 pg   MCHC 33.2 30.0 - 36.0 g/dL   RDW 81.1 91.4 - 78.2 %   Platelets 330 150 - 400 K/uL   Neutrophils Relative % 75 %   Neutro Abs 6.3 1.7 - 7.7 K/uL   Lymphocytes Relative 17 %   Lymphs Abs 1.4 0.7 - 4.0 K/uL   Monocytes Relative 2 %   Monocytes Absolute 0.2 0.1 - 1.0 K/uL   Eosinophils Relative 6 %   Eosinophils Absolute 0.5 0.0 - 0.7 K/uL   Basophils Relative 0 %   Basophils Absolute 0.0 0.0 - 0.1 K/uL  Basic metabolic panel  Result Value Ref Range  Sodium 139 135 - 145 mmol/L   Potassium 3.5 3.5 - 5.1 mmol/L   Chloride 104 101 - 111 mmol/L   CO2 26 22 - 32 mmol/L   Glucose, Bld 85 65 - 99 mg/dL   BUN 8 6 - 20 mg/dL   Creatinine, Ser 1.610.85 0.44 - 1.00 mg/dL   Calcium 8.5 (L) 8.9 - 10.3 mg/dL   GFR calc non Af Amer >60 >60 mL/min   GFR calc Af Amer >60 >60 mL/min   Anion gap 9 5 - 15  I-Stat CG4 Lactic Acid, ED  Result Value Ref Range   Lactic Acid, Venous 0.79 0.5 - 2.0 mmol/L   Dg Chest 2 View  03/29/2015  CLINICAL DATA:  19 year old with tachycardia, shortness of breath, fever, left-sided chest pain and urticaria. Personal history of pneumonia in September, 2016. EXAM: CHEST  2 VIEW COMPARISON:  01/14/2015 chest x-ray and CTA chest 01/15/2015. FINDINGS: Suboptimal inspiration accounts for crowded bronchovascular markings, especially in the bases, and accentuates the cardiac silhouette. Taking this into account, cardiomediastinal  silhouette unremarkable and unchanged. Interval resolution of the airspace consolidation in the medial left lower lobe with minimal scarring. Lungs otherwise clear. No localized airspace consolidation. No pleural effusions. No pneumothorax. Normal pulmonary vascularity. Visualized bony thorax intact. IMPRESSION: Suboptimal inspiration.  No acute cardiopulmonary disease. Electronically Signed   By: Holly Saashomas  Baird M.D.   On: 03/29/2015 21:55       Dione Boozeavid Yashar Inclan, MD 03/30/15 [id number]

## 2015-03-30 NOTE — Discharge Instructions (Signed)
Your blood tests showed some abnormal liver tests including AST, ALT, alkaline phosphatase, and bilirubin. This suggests that you have a viral hepatitis. That would account for your fever and generally not feeling well. Blood has been drawn to try to find out which virus is causing your hepatitis. Please follow-up with your primary care provider in 4-5 days to discuss the test results.  Ondansetron tablets What is this medicine? ONDANSETRON (on DAN se tron) is used to treat nausea and vomiting caused by chemotherapy. It is also used to prevent or treat nausea and vomiting after surgery. This medicine may be used for other purposes; ask your health care provider or pharmacist if you have questions. What should I tell my health care provider before I take this medicine? They need to know if you have any of these conditions: -heart disease -history of irregular heartbeat -liver disease -low levels of magnesium or potassium in the blood -an unusual or allergic reaction to ondansetron, granisetron, other medicines, foods, dyes, or preservatives -pregnant or trying to get pregnant -breast-feeding How should I use this medicine? Take this medicine by mouth with a glass of water. Follow the directions on your prescription label. Take your doses at regular intervals. Do not take your medicine more often than directed. Talk to your pediatrician regarding the use of this medicine in children. Special care may be needed. Overdosage: If you think you have taken too much of this medicine contact a poison control center or emergency room at once. NOTE: This medicine is only for you. Do not share this medicine with others. What if I miss a dose? If you miss a dose, take it as soon as you can. If it is almost time for your next dose, take only that dose. Do not take double or extra doses. What may interact with this medicine? Do not take this medicine with any of the following  medications: -apomorphine -certain medicines for fungal infections like fluconazole, itraconazole, ketoconazole, posaconazole, voriconazole -cisapride -dofetilide -dronedarone -pimozide -thioridazine -ziprasidone This medicine may also interact with the following medications: -carbamazepine -certain medicines for depression, anxiety, or psychotic disturbances -fentanyl -linezolid -MAOIs like Carbex, Eldepryl, Marplan, Nardil, and Parnate -methylene blue (injected into a vein) -other medicines that prolong the QT interval (cause an abnormal heart rhythm) -phenytoin -rifampicin -tramadol This list may not describe all possible interactions. Give your health care provider a list of all the medicines, herbs, non-prescription drugs, or dietary supplements you use. Also tell them if you smoke, drink alcohol, or use illegal drugs. Some items may interact with your medicine. What should I watch for while using this medicine? Check with your doctor or health care professional right away if you have any sign of an allergic reaction. What side effects may I notice from receiving this medicine? Side effects that you should report to your doctor or health care professional as soon as possible: -allergic reactions like skin rash, itching or hives, swelling of the face, lips or tongue -breathing problems -confusion -dizziness -fast or irregular heartbeat -feeling faint or lightheaded, falls -fever and chills -loss of balance or coordination -seizures -sweating -swelling of the hands or feet -tightness in the chest -tremors -unusually weak or tired Side effects that usually do not require medical attention (report to your doctor or health care professional if they continue or are bothersome): -constipation or diarrhea -headache This list may not describe all possible side effects. Call your doctor for medical advice about side effects. You may report side effects to FDA  at  1-800-FDA-1088. Where should I keep my medicine? Keep out of the reach of children. Store between 2 and 30 degrees C (36 and 86 degrees F). Throw away any unused medicine after the expiration date. NOTE: This sheet is a summary. It may not cover all possible information. If you have questions about this medicine, talk to your doctor, pharmacist, or health care provider.    2016, Elsevier/Gold Standard. (2013-01-11 16:27:45)  Nonspecific Tachycardia Tachycardia is a faster than normal heartbeat (more than 100 beats per minute). In adults, the heart normally beats between 60 and 100 times a minute. A fast heartbeat may be a normal response to exercise or stress. It does not necessarily mean that something is wrong. However, sometimes when your heart beats too fast it may not be able to pump enough blood to the rest of your body. This can result in chest pain, shortness of breath, dizziness, and even fainting. Nonspecific tachycardia means that the specific cause or pattern of your tachycardia is unknown. CAUSES  Tachycardia may be harmless or it may be due to a more serious underlying cause. Possible causes of tachycardia include:  Exercise or exertion.  Fever.  Pain or injury.  Infection.  Loss of body fluids (dehydration).  Overactive thyroid.  Lack of red blood cells (anemia).  Anxiety and stress.  Alcohol.  Caffeine.  Tobacco products.  Diet pills.  Illegal drugs.  Heart disease. SYMPTOMS  Rapid or irregular heartbeat (palpitations).  Suddenly feeling your heart beating (cardiac awareness).  Dizziness.  Tiredness (fatigue).  Shortness of breath.  Chest pain.  Nausea.  Fainting. DIAGNOSIS  Your caregiver will perform a physical exam and take your medical history. In some cases, a heart specialist (cardiologist) may be consulted. Your caregiver may also order:  Blood tests.  Electrocardiography. This test records the electrical activity of your  heart.  A heart monitoring test. TREATMENT  Treatment will depend on the likely cause of your tachycardia. The goal is to treat the underlying cause of your tachycardia. Treatment methods may include:  Replacement of fluids or blood through an intravenous (IV) tube for moderate to severe dehydration or anemia.  New medicines or changes in your current medicines.  Diet and lifestyle changes.  Treatment for certain infections.  Stress relief or relaxation methods. HOME CARE INSTRUCTIONS   Rest.  Drink enough fluids to keep your urine clear or pale yellow.  Do not smoke.  Avoid:  Caffeine.  Tobacco.  Alcohol.  Chocolate.  Stimulants such as over-the-counter diet pills or pills that help you stay awake.  Situations that cause anxiety or stress.  Illegal drugs such as marijuana, phencyclidine (PCP), and cocaine.  Only take medicine as directed by your caregiver.  Keep all follow-up appointments as directed by your caregiver. SEEK IMMEDIATE MEDICAL CARE IF:   You have pain in your chest, upper arms, jaw, or neck.  You become weak, dizzy, or feel faint.  You have palpitations that will not go away.  You vomit, have diarrhea, or pass blood in your stool.  Your skin is cool, pale, and wet.  You have a fever that will not go away with rest, fluids, and medicine. MAKE SURE YOU:   Understand these instructions.  Will watch your condition.  Will get help right away if you are not doing well or get worse.   This information is not intended to replace advice given to you by your health care provider. Make sure you discuss any questions you have with  your health care provider.   Document Released: 05/14/2004 Document Revised: 06/29/2011 Document Reviewed: 10/19/2014 Elsevier Interactive Patient Education [employer]

## 2015-03-31 LAB — HEPATITIS PANEL, ACUTE
HEP B S AG: NEGATIVE
Hep A IgM: NEGATIVE
Hep B C IgM: NEGATIVE

## 2015-04-03 ENCOUNTER — Telehealth: Payer: Self-pay | Admitting: *Deleted

## 2015-04-03 NOTE — Telephone Encounter (Signed)
Unable to leave message regarding scheduling New Patient appt.  Voice mail full.

## 2015-04-04 ENCOUNTER — Ambulatory Visit: Payer: BLUE CROSS/BLUE SHIELD | Admitting: Cardiovascular Disease

## 2015-04-04 ENCOUNTER — Telehealth: Payer: Self-pay | Admitting: *Deleted

## 2015-04-04 NOTE — Telephone Encounter (Signed)
I received request message from Dr. Antoine PocheHochrein - he requested patient be scheduled as new patient - can see any MD.  Will attempt to call today. A previous call from [place] went unanswered and she was unable to leave VM.

## 2015-04-04 NOTE — Telephone Encounter (Signed)
Phone rings and goes to full VM box.

## 2015-04-05 NOTE — Telephone Encounter (Signed)
Called and spoke w/ Holly Baird on 04/04/15 , made an appt for her to come in to see Dr. Allyson SabalBerry on 04/04/15 and she called back and cancel appt , did not want to r/s.

## 2015-04-05 NOTE — Telephone Encounter (Signed)
Forward to admin pool to schedule appointment.

## 2015-04-05 NOTE — Telephone Encounter (Signed)
Noted and discussed. Pt advised to call back if problems.

## 2016-05-17 IMAGING — CR DG CHEST 2V
2 series · 2 of 2 positions shown · non-contrast
Comparison: None.

CLINICAL DATA: Coughing.  Shortness of breath.  Tachycardia.

EXAM:
CHEST  2 VIEW

[chest pa]
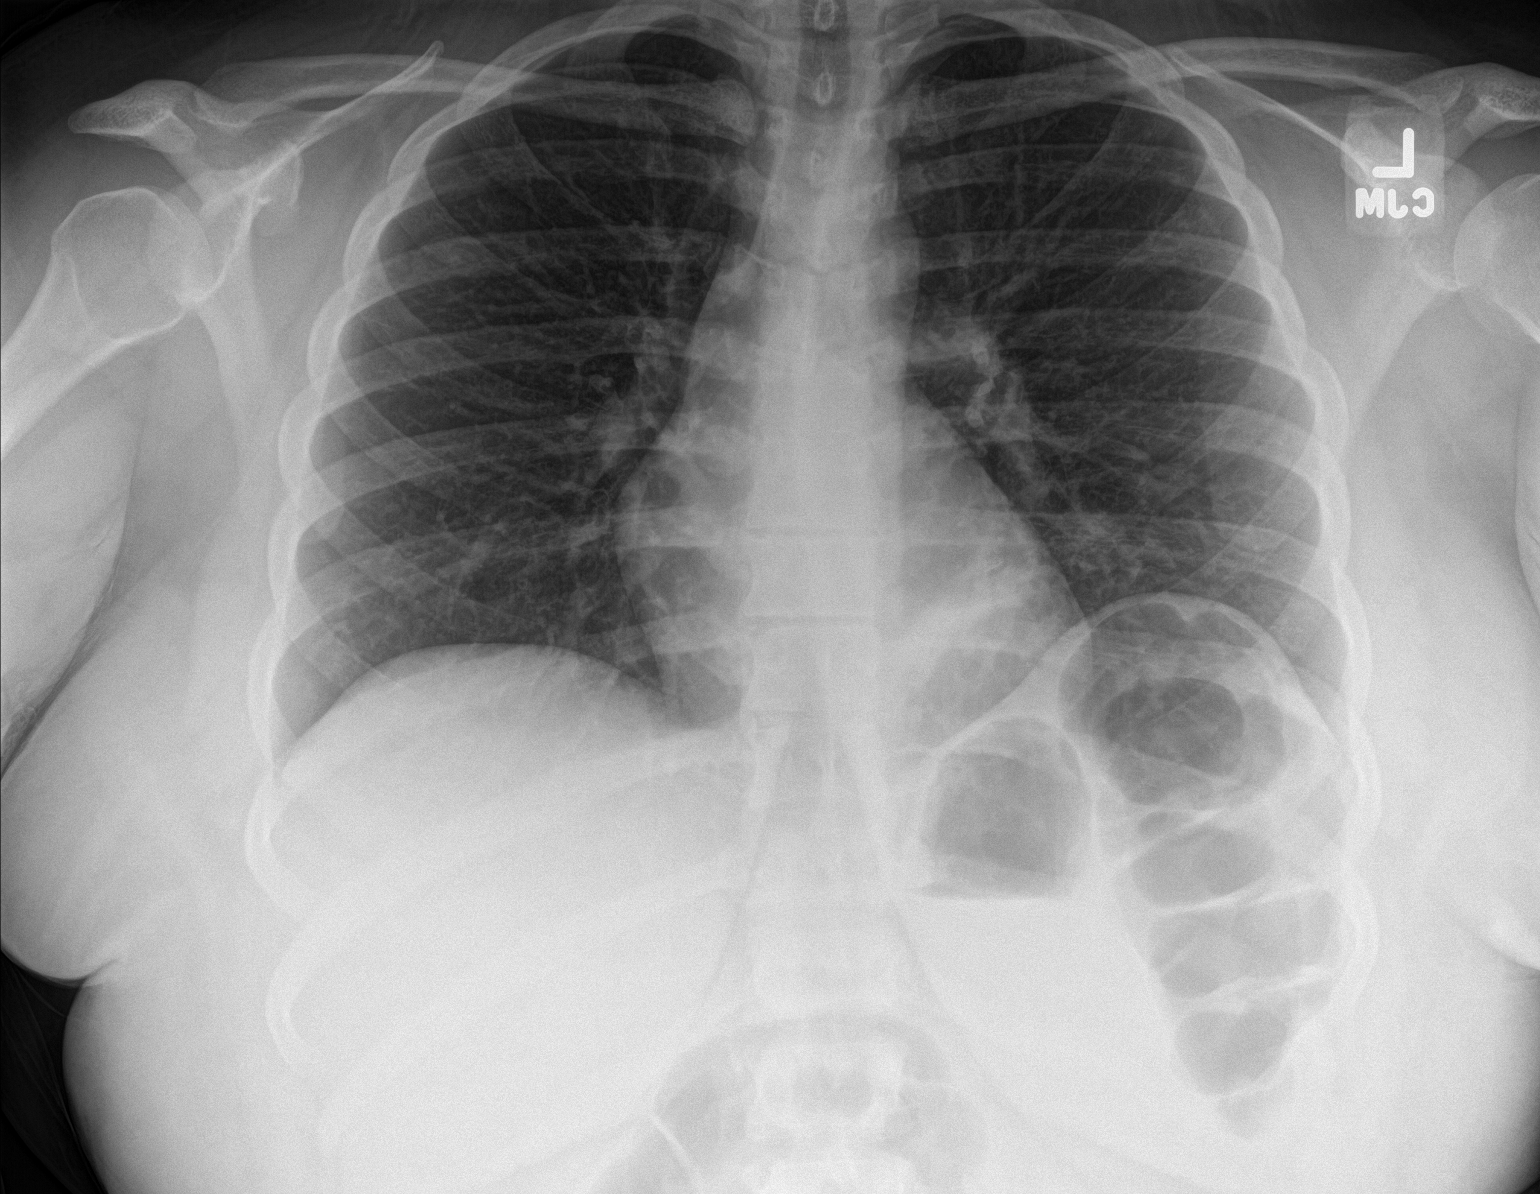

[chest lat]
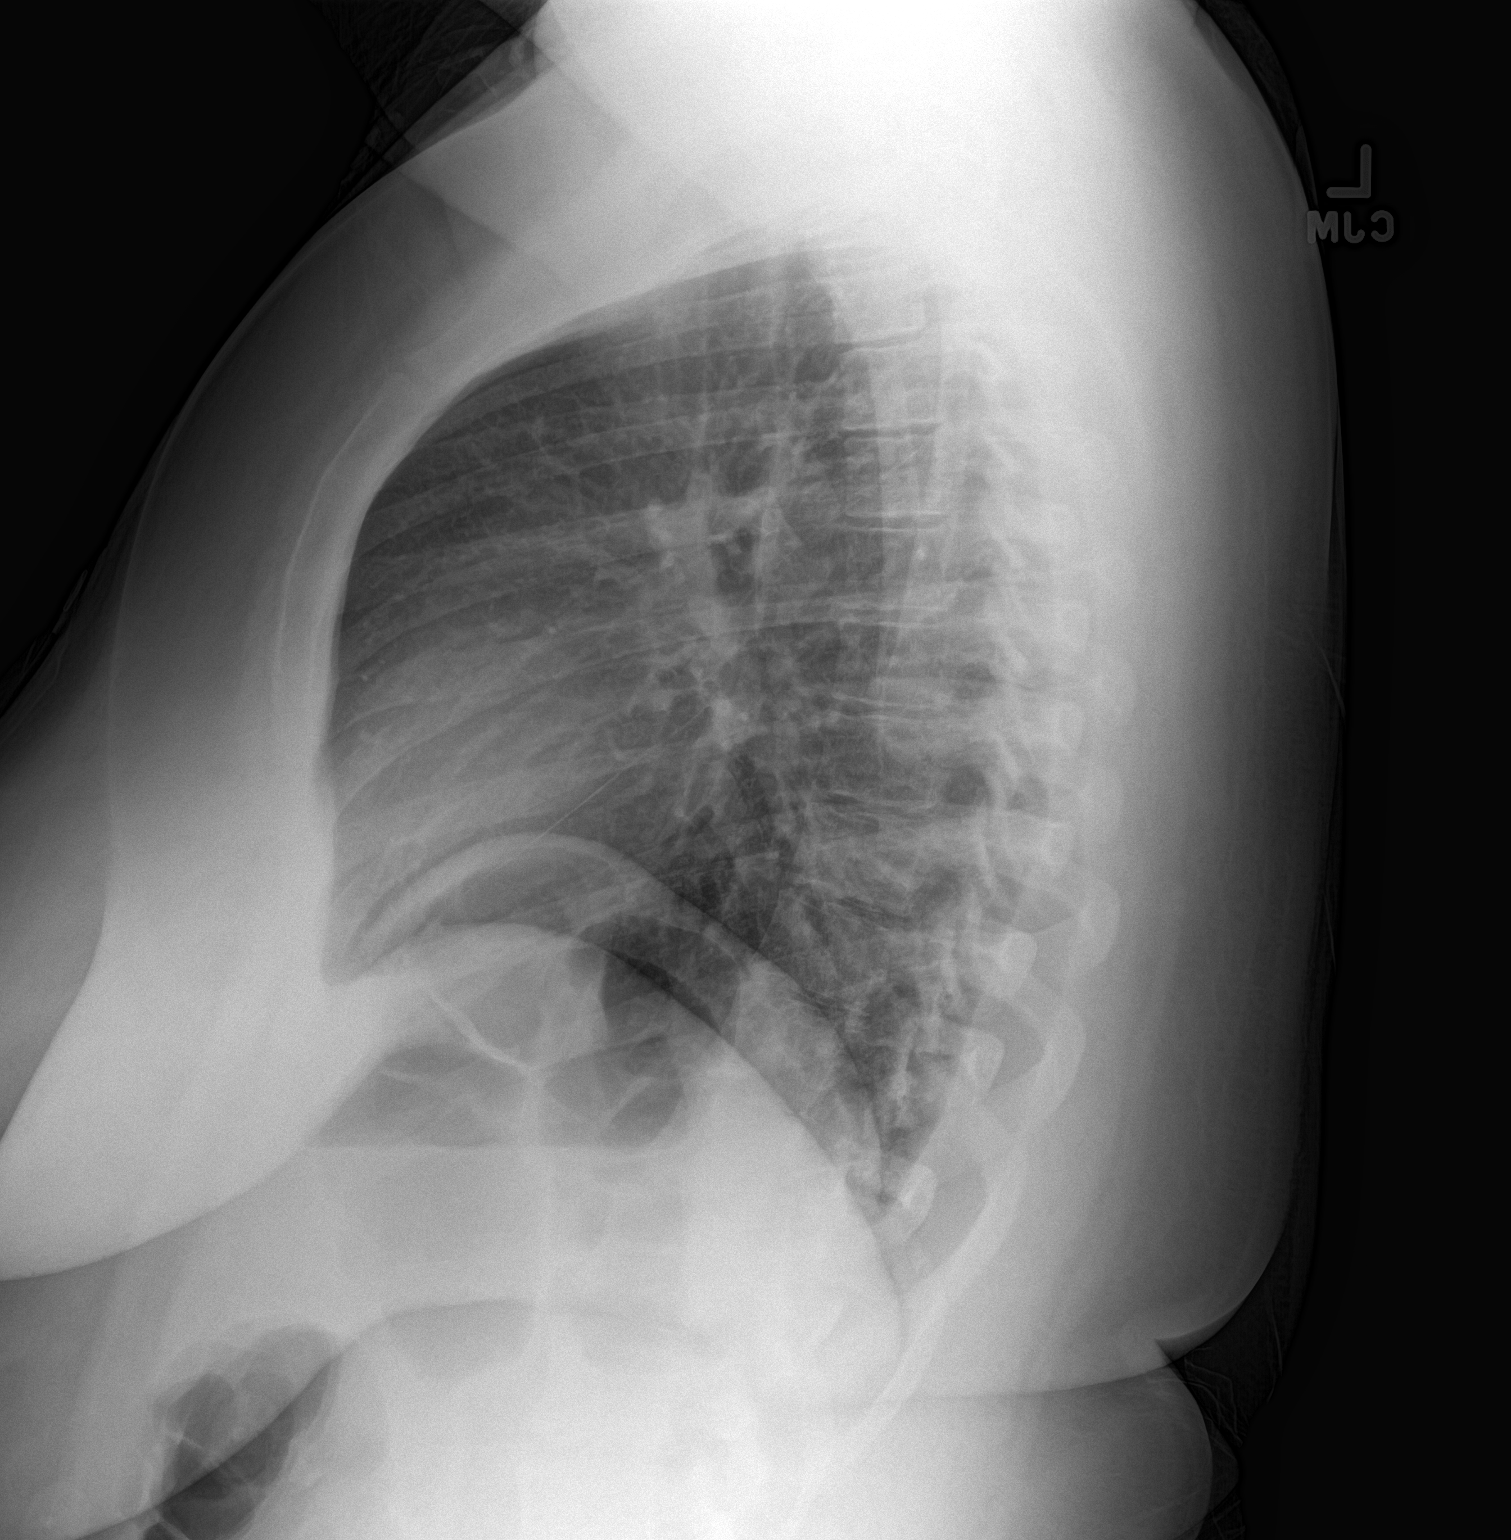

[2 of 2 positions shown; findings below may reference images not displayed]

FINDINGS: Abnormal left lower lobe band of opacity best appreciated on the
frontal projection. The lungs appear otherwise clear. No pleural
effusion. Cardiac and mediastinal margins appear normal.
IMPRESSION: 1. Retrocardiac band of opacity in the left lower lobe. Likely
differential diagnostic considerations given the radiographic
appearance include atelectasis and pneumonia.
